# Patient Record
Sex: Male | Born: 1985 | Race: White | Hispanic: No | Marital: Single | Smoking: Current every day smoker
Health system: Southern US, Community
[De-identification: ages and names within clinical notes are randomized; demographics above are authoritative.]

## PROBLEM LIST (undated history)

## (undated) HISTORY — PX: ABDOMINAL SURGERY: SHX537

---

## 2015-09-21 ENCOUNTER — Emergency Department (HOSPITAL_BASED_OUTPATIENT_CLINIC_OR_DEPARTMENT_OTHER): Payer: Self-pay

## 2015-09-21 ENCOUNTER — Emergency Department (HOSPITAL_BASED_OUTPATIENT_CLINIC_OR_DEPARTMENT_OTHER)
Admission: EM | Admit: 2015-09-21 | Discharge: 2015-09-21 | Disposition: A | Discharge status: Home / Self Care | Payer: Self-pay | Attending: Emergency Medicine | Admitting: Emergency Medicine

## 2015-09-21 ENCOUNTER — Encounter (HOSPITAL_BASED_OUTPATIENT_CLINIC_OR_DEPARTMENT_OTHER): Payer: Self-pay | Admitting: Emergency Medicine

## 2015-09-21 DIAGNOSIS — F17200 Nicotine dependence, unspecified, uncomplicated: Secondary | ICD-10-CM | POA: Insufficient documentation

## 2015-09-21 DIAGNOSIS — M79672 Pain in left foot: Secondary | ICD-10-CM | POA: Insufficient documentation

## 2015-09-21 DIAGNOSIS — M7989 Other specified soft tissue disorders: Secondary | ICD-10-CM | POA: Insufficient documentation

## 2015-09-21 NOTE — ED Notes (Signed)
Patient states that he was playing football about 1 week ago and today he felt like it should get looked at. Patient notes that he has pain to his left heel and he sees swelling. This Rn does not not any swelling.

## 2015-09-21 NOTE — ED Notes (Signed)
Pt made aware to return if symptoms worsen or if any life threatening symptoms occur.   

## 2015-09-21 NOTE — ED Provider Notes (Signed)
CSN: [id number]     Arrival date & time 09/21/15  1752 History   First MD Initiated Contact with Patient 09/21/15 1822     Chief Complaint  Patient presents with  . Foot Pain    HPI   30 year old male presents today with complaints of left foot pain. Patient reports that he was playing football on her lunch break approximately one week ago and felt small amount of pain to his left foot. He reports this was located in the heel, and was able to continue playing. He reports that pain continued with slight worsening and swelling to the medial aspect of his left leg. Patient reports he's been doing warm water soaks, but notes that he's continued to have pain. 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 Patient notes that he's been taking ibuprofen, Goody powders as needed for pain. No history of the same. Patient denies any warmth, redness to touch, decreased strength or motor function of the foot or ankle.  History reviewed. No pertinent past medical history. Past Surgical History  Procedure Laterality Date  . Abdominal surgery     History reviewed. No pertinent family history. Social History  Substance Use Topics  . Smoking status: Current Every Day Smoker  . Smokeless tobacco: None  . Alcohol Use: 3.6 oz/week    6 Cans of beer per week     Comment: had a beer before he came in     Review of Systems  All other systems reviewed and are negative.   Allergies  Review of patient's allergies indicates no known allergies.  Home Medications   Prior to Admission medications   Not on File   BP 141/94 mmHg  Pulse 99  Temp(Src) 98 F (36.7 C) (Oral)  Resp 18  Ht  (1.803 m)  Wt 77.111 kg  BMI 23.72 kg/m2  SpO2 100%   Physical Exam  Constitutional: He is oriented to person, place, and time. He appears well-developed and well-nourished.  HENT:  Head: Normocephalic and atraumatic.  Eyes:  Conjunctivae are normal. Pupils are equal, round, and reactive to light. Right eye exhibits no discharge. Left eye exhibits no discharge. No scleral icterus.  Neck: Normal range of motion. No JVD present. No tracheal deviation present.  Pulmonary/Chest: Effort normal. No stridor.  Musculoskeletal:  Minor swelling to the medial aspect of the left foot. No redness, warmth to touch. 5 out of 5 strength with plantarflexion and dorsiflexion. Achilles tendon nontender to palpation.  Neurological: He is alert and oriented to person, place, and time. Coordination normal.  Psychiatric: He has a normal mood and affect. His behavior is normal. Judgment and thought content normal.  Nursing note and vitals reviewed.   ED Course  Procedures (including critical care time) Labs Review Labs Reviewed - No data to display  Imaging Review Dg Os Calcis Left  09/21/2015  CLINICAL DATA:  Left heel pain when walking for 5 days. EXAM: LEFT OS CALCIS - 2+ VIEW COMPARISON:  None. FINDINGS: Mild diffuse soft tissue swelling in the left heel. No fracture or focal osseous lesion. IMPRESSION: Mild diffuse soft tissue swelling in the left heel. No fracture or focal osseous lesion. Electronically Signed   By: Delbert Phenix M.D.   On: 09/21/2015 18:45   Dg Foot Complete Left  09/21/2015  CLINICAL DATA:  Left heel pain when walking for 5 days. EXAM: LEFT FOOT - COMPLETE 3+  VIEW COMPARISON:  None. FINDINGS: There is no evidence of fracture or dislocation. There is no evidence of arthropathy or other focal bone abnormality. Soft tissues are unremarkable. IMPRESSION: Negative. Electronically Signed   By: Delbert PhenixJason A Poff M.D.   On: 09/21/2015 18:44   I have personally reviewed and evaluated these images and lab results as part of my medical decision-making.   EKG Interpretation None      MDM   Final diagnoses:  Left foot pain    Labs:  Imaging:  Consults:  Therapeutics:  Discharge Meds:   Assessment/Plan:Patient  presents with left foot pain. Patient has minor amount of swelling, no signs of infectious etiology, no acute fractures. Patient has been applying heat to the injury and continues to work on this. Patient will be instructed to rest, ice, ibuprofen, follow-up with sports medicine if symptoms continue persist beyond 2 weeks. No signs of infectious etiology on today's exam. Patient will be discharged home with strict return precautions and follow-up information. Patient verbalized understanding and agreement to today's plan had no further questions or concerns at the time discharge   Eyvonne MechanicJeffrey Ollin Hochmuth, PA-C 09/21/15 1951  Nelva Nayobert Beaton, MD 09/22/15 1309

## 2016-12-02 IMAGING — DX DG FOOT COMPLETE 3+V*L*
3 series · 3 of 3 positions shown · non-contrast
Comparison: None.

CLINICAL DATA: Left heel pain when walking for 5 days.

EXAM:
LEFT FOOT - COMPLETE 3+ VIEW

[foot ap]
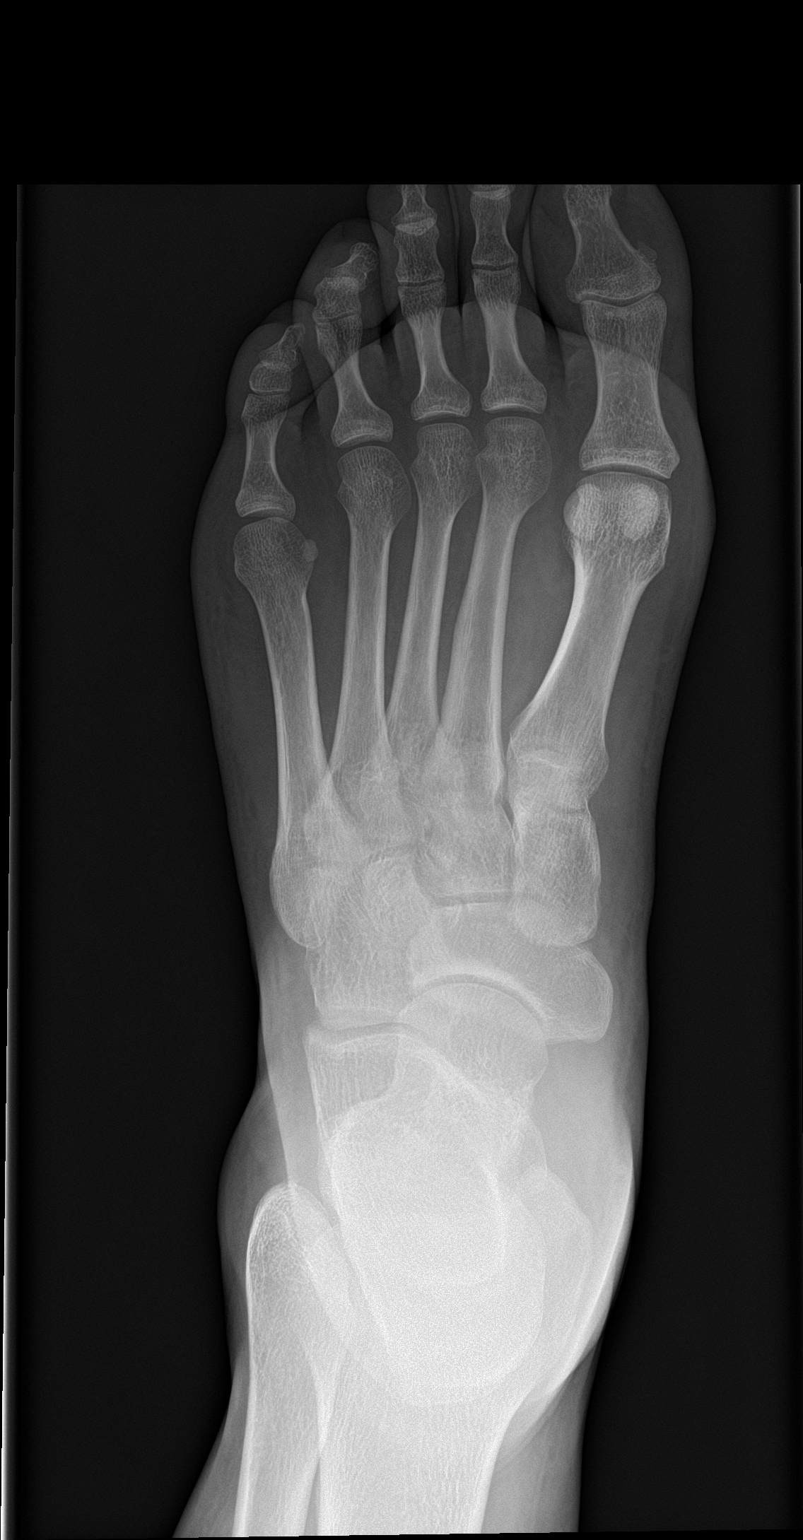

[foot obl]
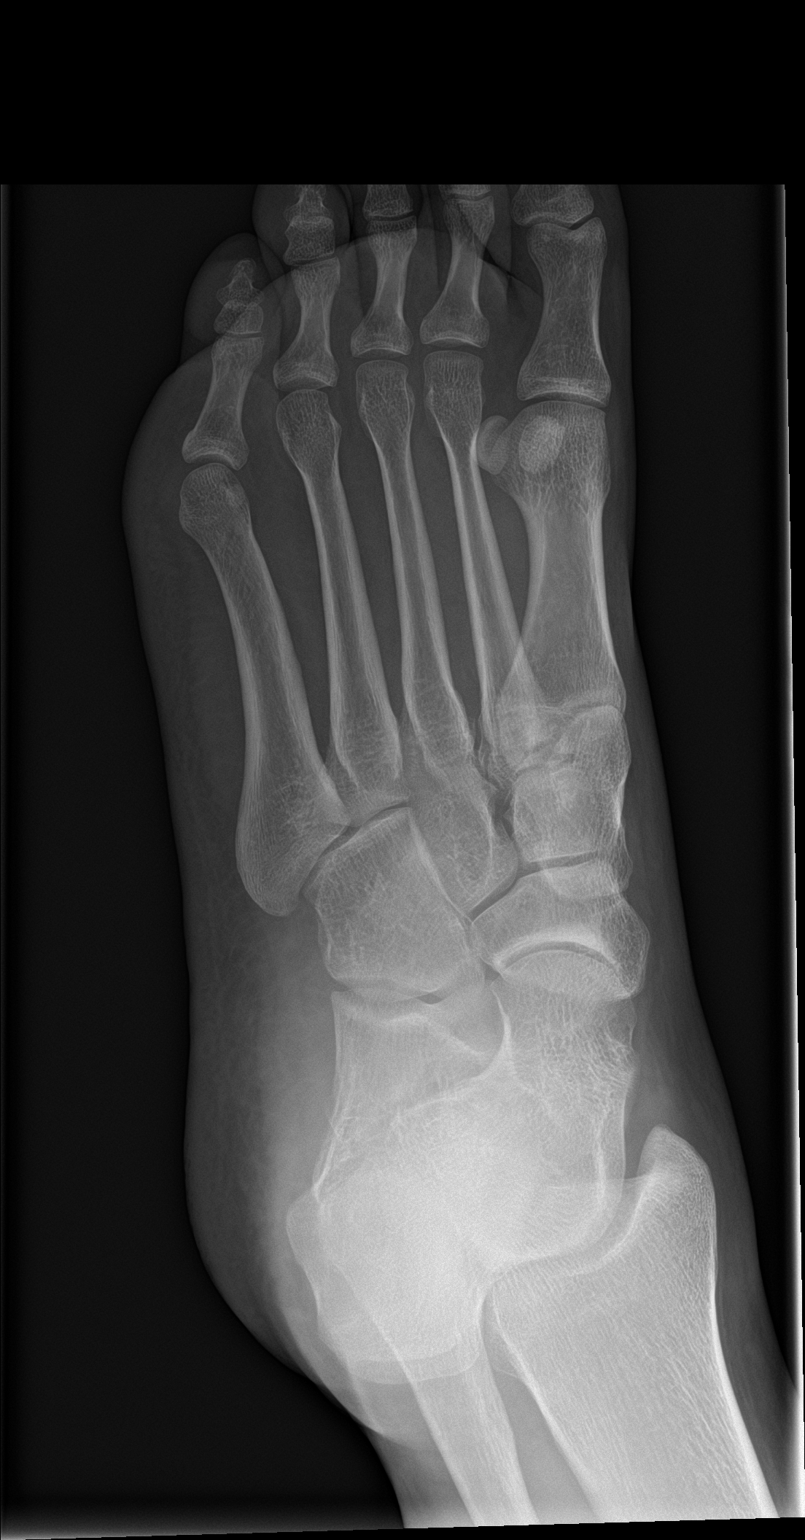

[foot lat]
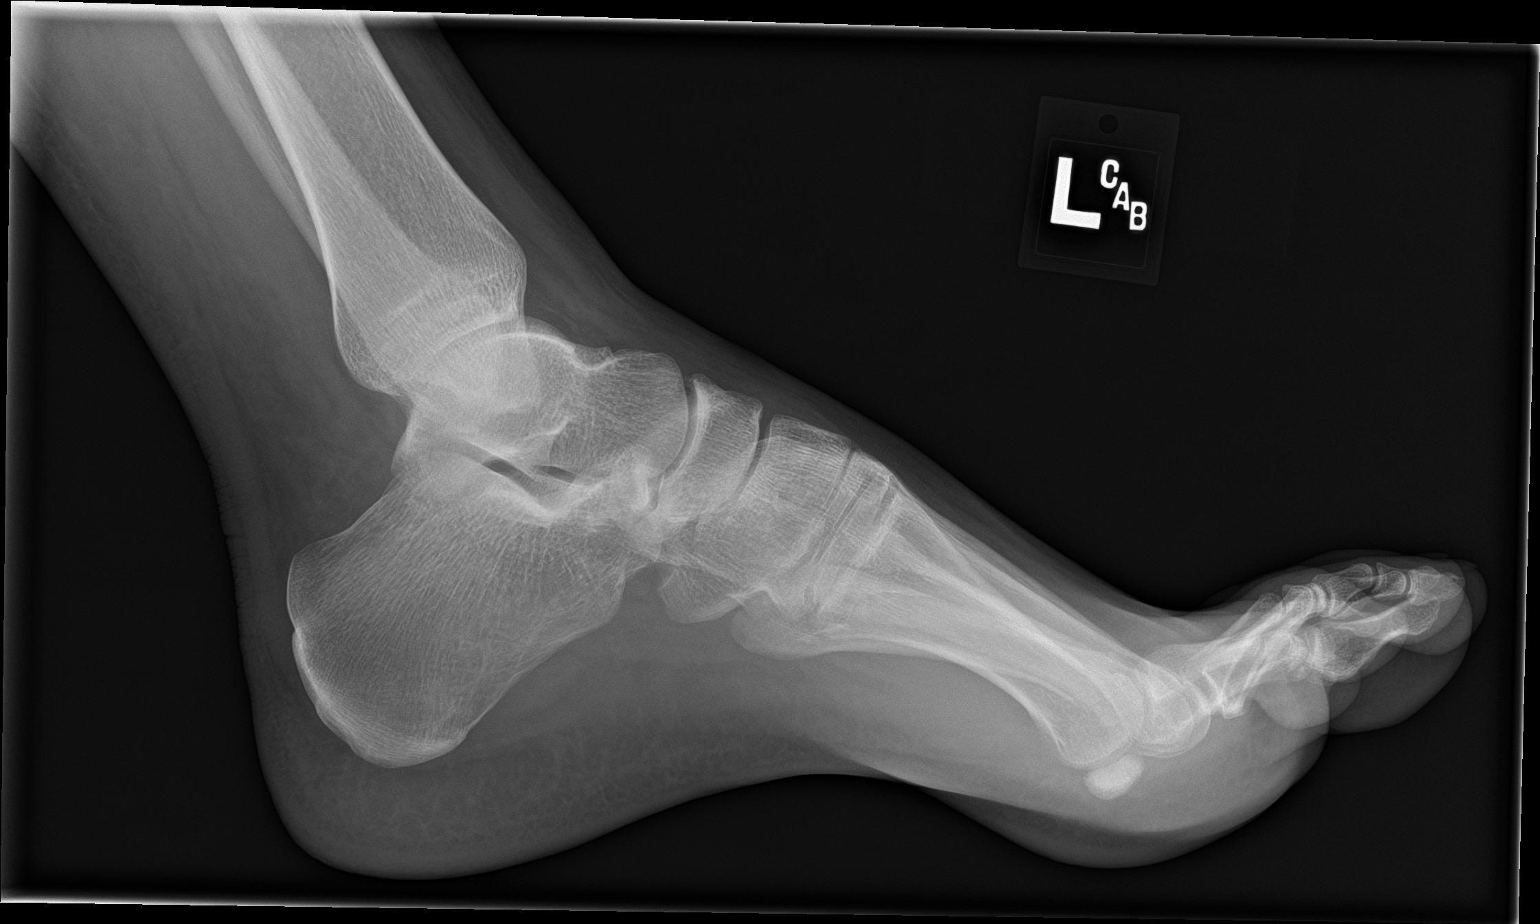

[3 of 3 positions shown; findings below may reference images not displayed]

FINDINGS: There is no evidence of fracture or dislocation. There is no
evidence of arthropathy or other focal bone abnormality. Soft
tissues are unremarkable.
IMPRESSION: Negative.
